# Patient Record
Sex: Female | Born: 1985 | Race: White | Hispanic: No | Marital: Married | Smoking: Never smoker
Health system: Southern US, Community
[De-identification: ages and names within clinical notes are randomized; demographics above are authoritative.]

## PROBLEM LIST (undated history)

## (undated) DIAGNOSIS — I509 Heart failure, unspecified: Secondary | ICD-10-CM

## (undated) DIAGNOSIS — O021 Missed abortion: Secondary | ICD-10-CM

## (undated) HISTORY — PX: OTHER SURGICAL HISTORY: SHX169

## (undated) HISTORY — PX: WISDOM TOOTH EXTRACTION: SHX21

---
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
## 2005-04-08 ENCOUNTER — Ambulatory Visit (HOSPITAL_BASED_OUTPATIENT_CLINIC_OR_DEPARTMENT_OTHER): Admission: RE | Admit: 2005-04-08 | Discharge: 2005-04-08 | Payer: Self-pay | Admitting: Orthopedic Surgery

## 2005-04-08 ENCOUNTER — Ambulatory Visit (HOSPITAL_COMMUNITY): Admission: RE | Admit: 2005-04-08 | Discharge: 2005-04-08 | Payer: Self-pay | Admitting: Orthopedic Surgery

## 2005-06-30 ENCOUNTER — Other Ambulatory Visit: Admission: RE | Admit: 2005-06-30 | Discharge: 2005-06-30 | Payer: Self-pay | Admitting: Obstetrics and Gynecology

## 2006-08-01 ENCOUNTER — Encounter: Admission: RE | Admit: 2006-08-01 | Discharge: 2006-08-01 | Payer: Self-pay | Admitting: *Deleted

## 2007-04-22 DIAGNOSIS — O021 Missed abortion: Secondary | ICD-10-CM

## 2007-04-22 HISTORY — DX: Missed abortion: O02.1

## 2007-07-09 ENCOUNTER — Ambulatory Visit (HOSPITAL_COMMUNITY): Admission: RE | Admit: 2007-07-09 | Discharge: 2007-07-09 | Payer: Self-pay | Admitting: Obstetrics and Gynecology

## 2009-01-07 ENCOUNTER — Inpatient Hospital Stay (HOSPITAL_COMMUNITY): Admission: AD | Admit: 2009-01-07 | Discharge: 2009-01-07 | Payer: Self-pay | Admitting: Obstetrics and Gynecology

## 2009-01-13 ENCOUNTER — Inpatient Hospital Stay (HOSPITAL_COMMUNITY): Admission: AD | Admit: 2009-01-13 | Discharge: 2009-01-17 | Payer: Self-pay | Admitting: Obstetrics and Gynecology

## 2009-01-20 ENCOUNTER — Inpatient Hospital Stay: Payer: Self-pay | Admitting: *Deleted

## 2010-07-26 LAB — CBC
HCT: 34.1 % — ABNORMAL LOW (ref 36.0–46.0)
Hemoglobin: 11.5 g/dL — ABNORMAL LOW (ref 12.0–15.0)
MCHC: 33.1 g/dL (ref 30.0–36.0)
MCHC: 33.6 g/dL (ref 30.0–36.0)
MCV: 87.8 fL (ref 78.0–100.0)
RBC: 3.04 MIL/uL — ABNORMAL LOW (ref 3.87–5.11)
RDW: 13.6 % (ref 11.5–15.5)

## 2010-09-06 NOTE — Op Note (Signed)
Laurie Dorsey, Laurie Dorsey                           ACCOUNT NO.:  [id number]   MEDICAL RECORD NO.:  [id number]                   PATIENT TYPE:  AMB   LOCATION:  DSC                                  FACILITY:  [facility]   PHYSICIAN:  Robert A. Thurston Hole, M.D.              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   PROCEDURES:  1. Left knee examination under anesthesia followed by arthroscopic     chondroplasty.  2. Left knee partial synovectomy.  3. Left knee lateral release.   SURGEON:  Elana Alm. Thurston Hole, M.D.   ASSISTANT:  Julien Girt, P.A.   ANESTHESIA:  General.   OPERATIVE TIME:  Thirty minutes.   COMPLICATIONS:  None.   INDICATIONS FOR PROCEDURE:  Laurie Dorsey is a 25 year old who has had two years of  left knee pain increasing in nature with signs and symptoms documenting  patellofemoral chondromalacia and patella subluxation who has failed  conservative care and is now to undergo arthroscopy.   DESCRIPTION OF PROCEDURE:  Laurie Dorsey was brought to the operating room on November 28, 2002 and placed on the operative table in the supine position.  After an  adequate level of general anesthesia was obtained, her left knee was  examined under anesthesia.  She had full range of motion and her knee was  stable ligamentous exam with slight lateral patellar tracking noted and  lateral patella subluxation noted.  The left knee was sterilely injected  with 0.25% Marcaine with epinephrine.  The left leg was prepped using  sterile DuraPrep and draped using sterile technique.  Originally, through an  inferolateral portal, the arthroscope with a pump attached was placed into  an inferomedial portal and  arthroscopic probe was placed.  On initial  inspection of the medial compartment, the articular cartilage was normal and  medial meniscus normal.  The intercolumnar notch was inspected.  Anterior/posterior cruciate ligaments were normal.  The lateral compartment  inspected.  The articular cartilage was normal.  Lateral meniscus was  normal.  Patellofemoral joint showed 20% grade 3 and the rest grade 1 and 2  chondromalacia and this was debrided.  The patella showed moderate lateral  patellar tracking with significant hypertrophic synovitis in the lateral  gutter and this was thoroughly debrided.  Medial gutter showed moderate  synovitis and this was debrided.  A lateral release was then carried out  with an ArthroCare wand and no excessive bleeding was encountered and this  significantly improved patellar tracking to normal.  After this was done, no  further pathology was noted.  At this point, the instruments were removed,  portals closed with 3-0 nylon sutures.  Sterile dressings  were applied and  the patient awakened and taken to the recovery room in stable condition.   FOLLOWUP CARE:  Laurie Dorsey will be followed as an outpatient on Vicodin and  Naprosyn.  I will see her back in the office in a week for sutures out and  followup.                                               Robert A. Thurston Hole, M.D.    RAW/MEDQ  D:  11/28/2002  T:  11/28/2002  Job:  [id number]

## 2010-09-06 NOTE — Op Note (Signed)
Laurie Dorsey, Laurie Dorsey               ACCOUNT NO.:  [id number]   MEDICAL RECORD NO.:  [id number]          PATIENT TYPE:  AMB   LOCATION:  DSC                          FACILITY:  [facility]   PHYSICIAN:  Robert A. Thurston Hole, M.D. DATE OF BIRTH:  June 30, 1985   DATE OF PROCEDURE:  04/08/2005  DATE OF DISCHARGE:                                 OPERATIVE REPORT   PREOPERATIVE DIAGNOSIS:  Left knee chondromalacia and synovitis.   POSTOPERATIVE DIAGNOSIS:  Left knee chondromalacia and synovitis.   PROCEDURE:  Left knee examination under anesthesia followed by arthroscopic  chondroplasty with partial synovectomy.   SURGEON:  Elana Alm. Thurston Hole, M.D.   ASSISTANT:  Julien Girt, P.A.   ANESTHESIA:  Local and MAC.   OPERATIVE TIME:  Thirty minutes.   COMPLICATIONS:  None.   INDICATIONS FOR PROCEDURE:  [name] is 25 year old who twisted her left knee  approximately 2-1/2 to 3 months ago with persistent significant pain, with  exam and MRI documenting chondromalacia and synovitis, who has failed  conservative care and is now to undergo arthroscopy.   DESCRIPTION:  [name] was brought to operating room on April 08, 2005 after  a knee block had been placed in the holding room by Anesthesia.  She was  placed on the operative table in supine position.  Her left knee was  examined under anesthesia.  Range of motion -- 0-130 degrees, knee stable to  ligamentous exam with normal patellar tracking.  Her left leg was prepped  using sterile DuraPrep and draped using sterile technique.  Originally,  through an anterolateral portal, the arthroscope with a pump attachment was  placed and through an anteromedial portal, an arthroscopic probe was placed.  On initial inspection of the medial compartment, articular cartilage was  normal.  Medial meniscus was normal.  Intercondylar notch inspected;  anterior and posterior cruciate ligaments were normal.  Lateral compartment  inspected; the articular cartilage was  normal; lateral meniscus was normal.  Patellofemoral joint showed a distinct grade 3 chondral defect on the medial  patellar facet, which was debrided.  There was a scarred medial plica band  which was debrided.  The patella tracked normally.  No other pathology of  the articular cartilage was noted on the patellofemoral joint.  There was  moderate lateral synovitis as well and this was debrided as well.  The  patient had had a previous lateral release and this was well-healed with  normal patellar tracking.  No other pathology was noted in the medial and  lateral gutters.  After this was done, it was felt that all pathology had  been satisfactorily addressed.  The instruments were removed.  Portals were  closed with 3-0 nylon suture and injected with 0.25% Marcaine with  epinephrine and 4 mg of morphine.  Sterile dressings were applied and the  patient awakened and taken to the recovery room in stable condition.   FOLLOWUP CARE:  Kennedi will be followed as an outpatient on Vicodin and  Naprosyn.  I will see her back in a week for sutures out and followup.      Robert A.  Thurston Hole, M.D.  Electronically Signed     RAW/MEDQ  D:  04/08/2005  T:  04/10/2005  Job:  [id number]

## 2011-01-13 LAB — ABO/RH: ABO/RH(D): O POS

## 2011-06-13 ENCOUNTER — Other Ambulatory Visit (HOSPITAL_COMMUNITY): Payer: Self-pay | Admitting: Obstetrics and Gynecology

## 2011-06-13 DIAGNOSIS — R1900 Intra-abdominal and pelvic swelling, mass and lump, unspecified site: Secondary | ICD-10-CM

## 2011-06-17 ENCOUNTER — Ambulatory Visit (HOSPITAL_COMMUNITY)
Admission: RE | Admit: 2011-06-17 | Discharge: 2011-06-17 | Disposition: A | Discharge status: Home / Self Care | Payer: BC Managed Care – PPO | Source: Ambulatory Visit | Attending: Obstetrics and Gynecology | Admitting: Obstetrics and Gynecology

## 2011-06-17 DIAGNOSIS — R1031 Right lower quadrant pain: Secondary | ICD-10-CM | POA: Insufficient documentation

## 2011-06-17 DIAGNOSIS — N949 Unspecified condition associated with female genital organs and menstrual cycle: Secondary | ICD-10-CM | POA: Insufficient documentation

## 2011-06-17 DIAGNOSIS — R1900 Intra-abdominal and pelvic swelling, mass and lump, unspecified site: Secondary | ICD-10-CM | POA: Insufficient documentation

## 2011-06-17 MED ORDER — IOHEXOL 300 MG/ML  SOLN
100.0000 mL | Freq: Once | INTRAMUSCULAR | Status: AC | PRN
  Administered 2011-06-17: 100 mL via INTRAVENOUS

## 2012-10-31 IMAGING — CT CT ABD-PELV W/ CM
1 of 2 series · 16 of 32 positions shown, 20 images · IV contrast (OMNIPAQUE)
Comparison: August 01, 2006

CLINICAL DATA: Right lower quadrant pain; palpable knot in pelvic
area

CT ABDOMEN AND PELVIS WITH CONTRAST
TECHNIQUE: Multidetector CT imaging of the abdomen and pelvis was
performed following the standard protocol during bolus
administration of intravenous contrast.
Contrast:  100 ml Omni 300

[Series 2: routine abdomen/pelvis with · axial · 0.73mm/px · z∈[-480,-50]mm · 16 of 94 slices shown, 20 images]
[im 4/94  soft-tissue]
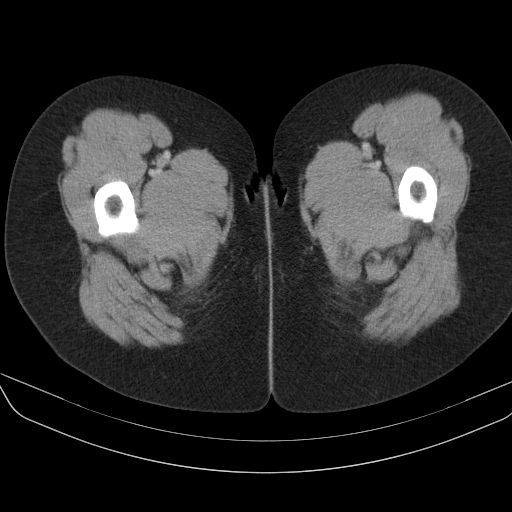
[im 4/94  bone]
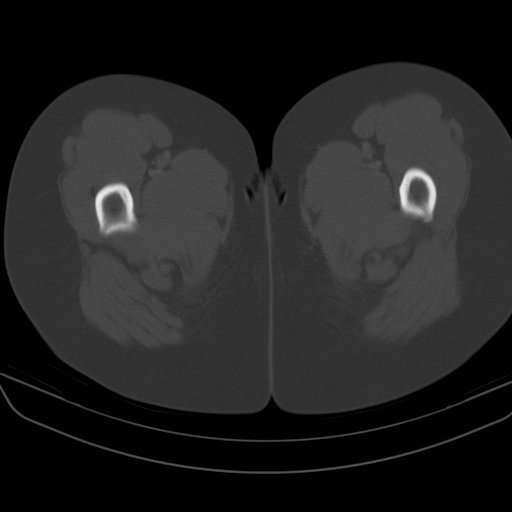
[im 12/94  soft-tissue]
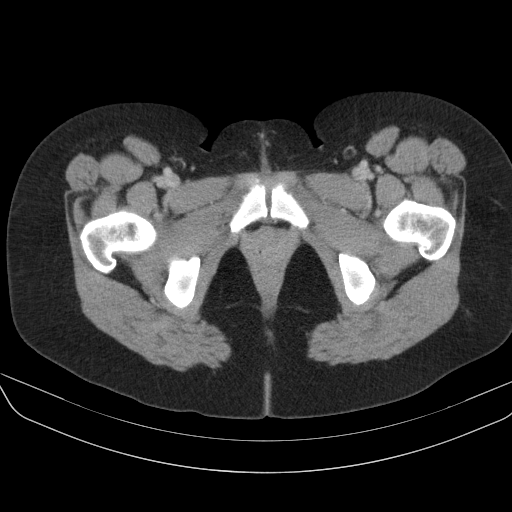
[im 20/94  soft-tissue]
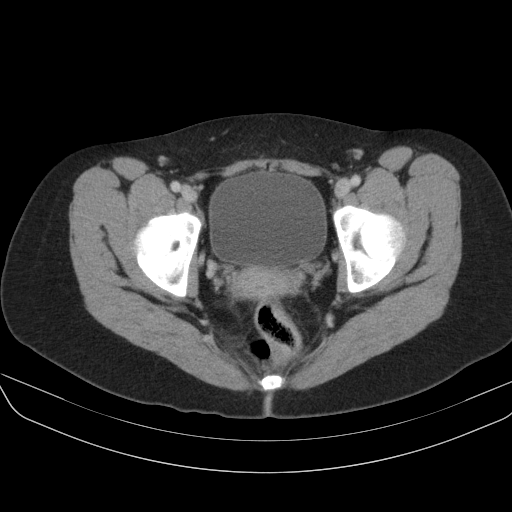
[im 24/94  soft-tissue]
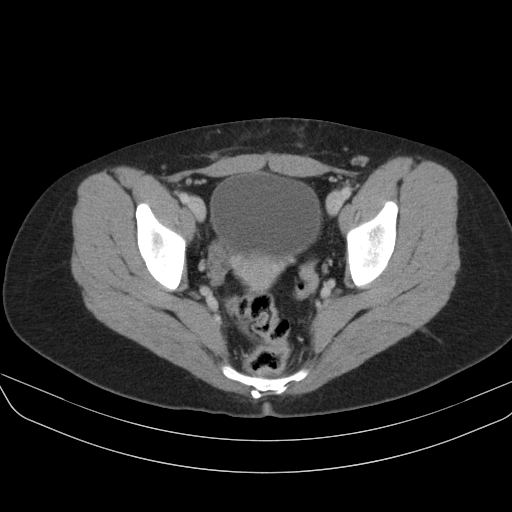
[im 32/94  soft-tissue]
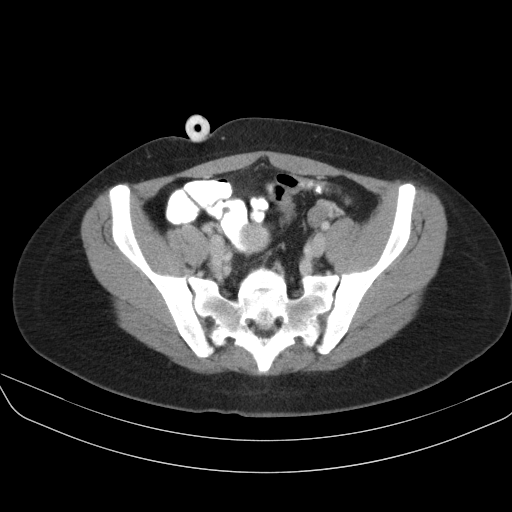
[im 39/94  soft-tissue]
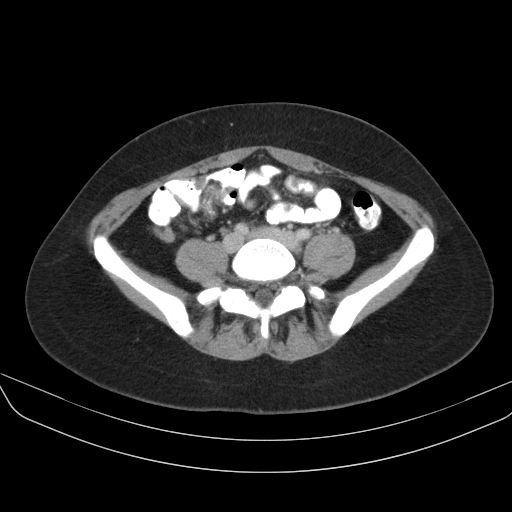
[im 43/94  soft-tissue]
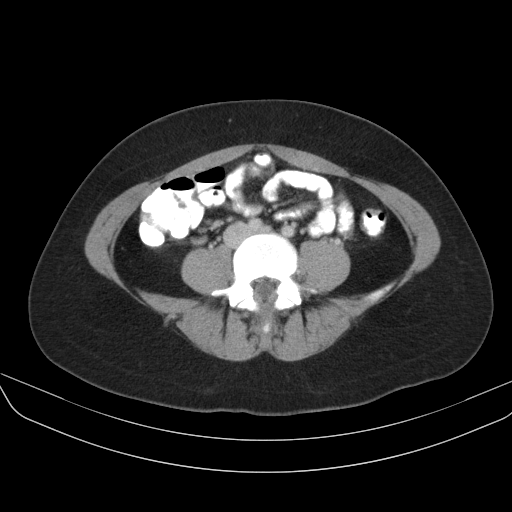
[im 51/94  soft-tissue]
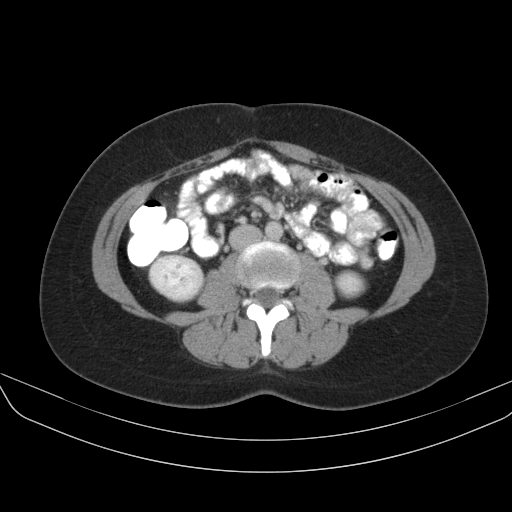
[im 55/94  soft-tissue]
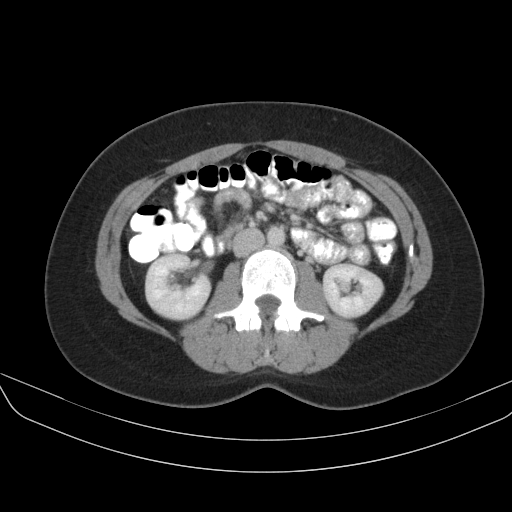
[im 55/94  bone]
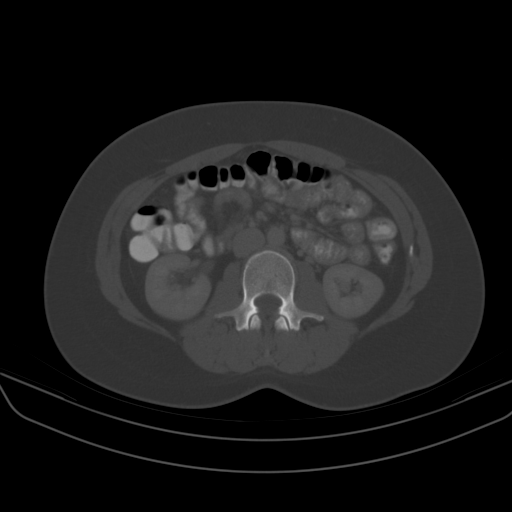
[im 63/94  soft-tissue]
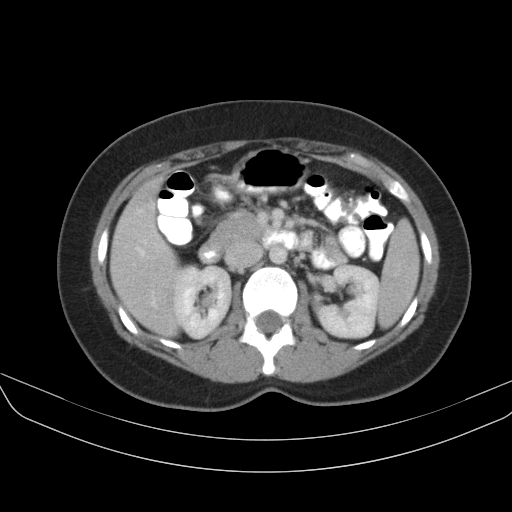
[im 70/94  soft-tissue]
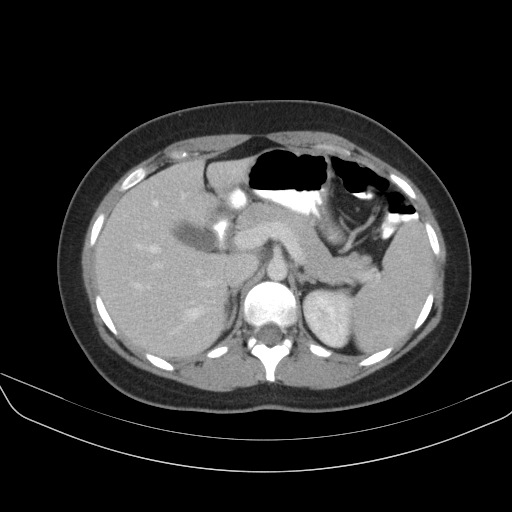
[im 74/94  soft-tissue]
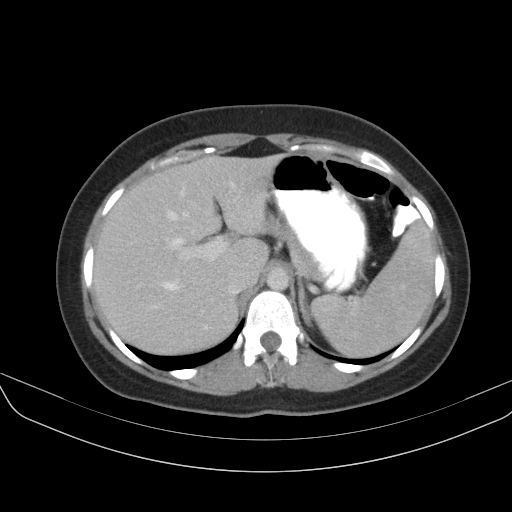
[im 78/94  lung]
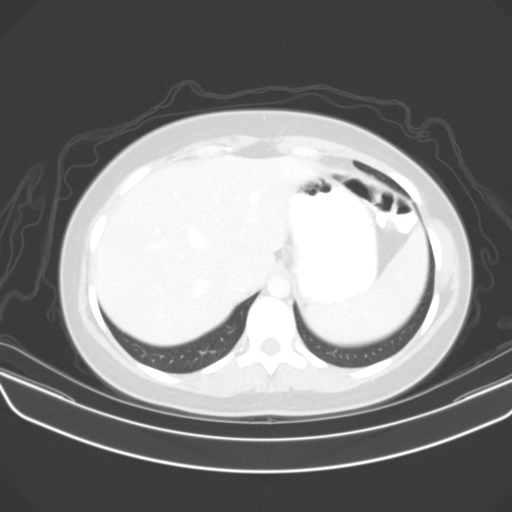
[im 82/94  soft-tissue]
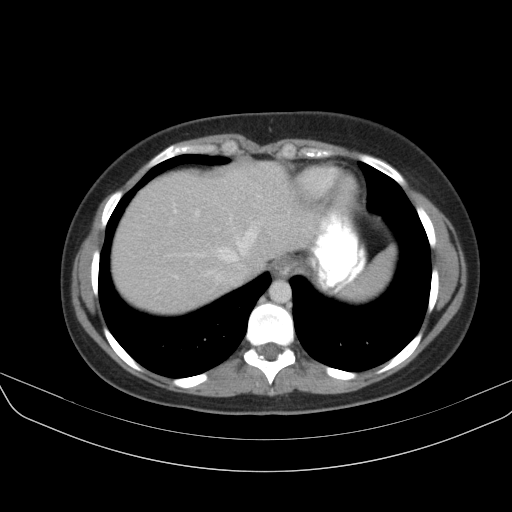
[im 82/94  lung]
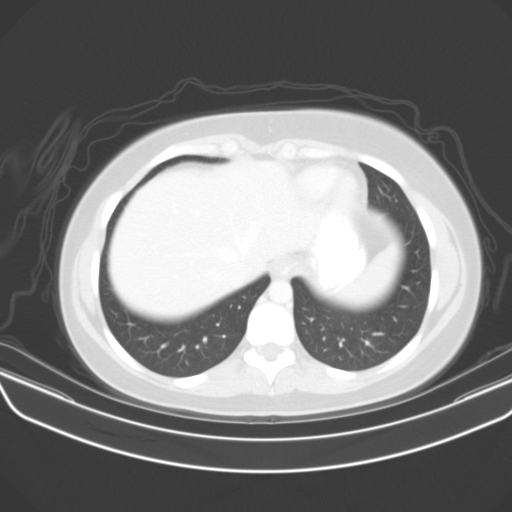
[im 86/94  lung]
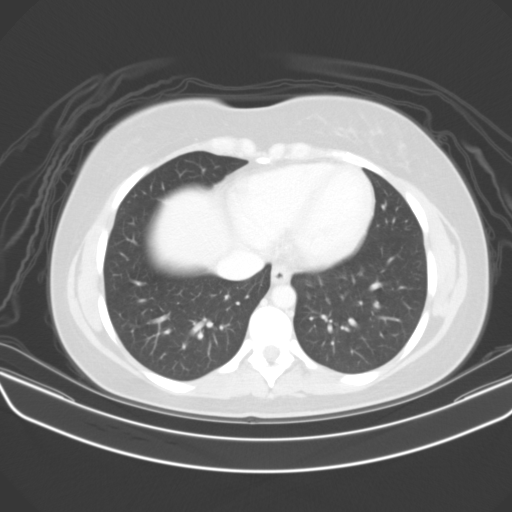
[im 90/94  soft-tissue]
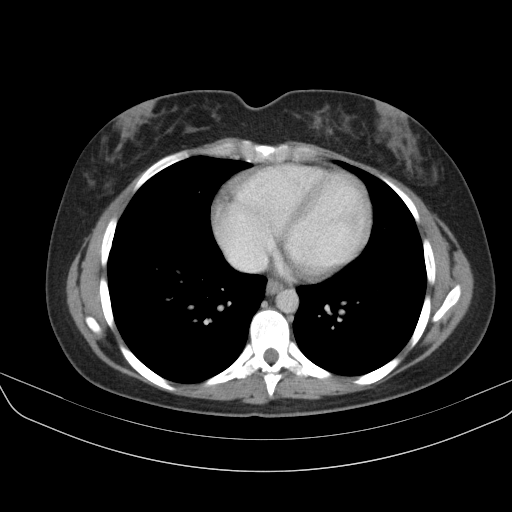
[im 90/94  lung]
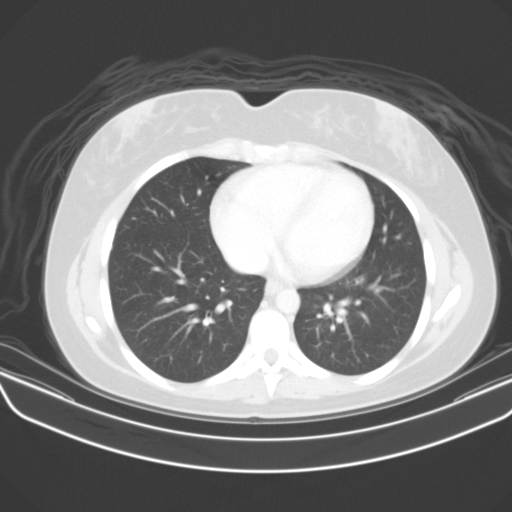

[16 of 32 positions shown; findings below may reference images not displayed]

FINDINGS: The lung bases are clear.  The liver, gallbladder,
spleen, pancreas, adrenal glands, kidneys, urinary bladder, uterus
and adnexa, osseous structures have a normal appearance..  The
appendix is seen in the right lower quadrant and has a normal
appearance.  The bowel is unremarkable with no evidence of gross
inflammation or obstruction.  There is no pneumoperitoneum, free
fluid, or adenopathy within the abdomen or pelvis.  There is no
soft tissue mass in the patient's region of palpable concern which
has been marked by a tourniquet.
IMPRESSION: Normal CT scan of the abdomen pelvis.

## 2013-02-01 LAB — OB RESULTS CONSOLE ABO/RH: RH TYPE: POSITIVE

## 2013-02-01 LAB — OB RESULTS CONSOLE HIV ANTIBODY (ROUTINE TESTING): HIV: NONREACTIVE

## 2013-02-01 LAB — OB RESULTS CONSOLE ANTIBODY SCREEN: ANTIBODY SCREEN: NEGATIVE

## 2013-02-01 LAB — OB RESULTS CONSOLE RPR: RPR: NONREACTIVE

## 2013-02-01 LAB — OB RESULTS CONSOLE HEPATITIS B SURFACE ANTIGEN: Hepatitis B Surface Ag: NEGATIVE

## 2013-02-01 LAB — OB RESULTS CONSOLE RUBELLA ANTIBODY, IGM: Rubella: NON-IMMUNE/NOT IMMUNE

## 2013-08-08 ENCOUNTER — Encounter (HOSPITAL_COMMUNITY): Payer: Self-pay | Admitting: *Deleted

## 2013-08-08 ENCOUNTER — Inpatient Hospital Stay (HOSPITAL_COMMUNITY)
Admission: AD | Admit: 2013-08-08 | Discharge: 2013-08-08 | Disposition: A | Discharge status: Home / Self Care | Payer: BC Managed Care – PPO | Source: Ambulatory Visit | Attending: Obstetrics and Gynecology | Admitting: Obstetrics and Gynecology

## 2013-08-08 DIAGNOSIS — R609 Edema, unspecified: Secondary | ICD-10-CM | POA: Insufficient documentation

## 2013-08-08 DIAGNOSIS — O4700 False labor before 37 completed weeks of gestation, unspecified trimester: Secondary | ICD-10-CM | POA: Insufficient documentation

## 2013-08-08 DIAGNOSIS — O99891 Other specified diseases and conditions complicating pregnancy: Secondary | ICD-10-CM | POA: Insufficient documentation

## 2013-08-08 DIAGNOSIS — O9989 Other specified diseases and conditions complicating pregnancy, childbirth and the puerperium: Principal | ICD-10-CM

## 2013-08-08 DIAGNOSIS — R079 Chest pain, unspecified: Secondary | ICD-10-CM | POA: Insufficient documentation

## 2013-08-08 HISTORY — DX: Heart failure, unspecified: I50.9

## 2013-08-08 LAB — CBC
HCT: 34.4 % — ABNORMAL LOW (ref 36.0–46.0)
HEMOGLOBIN: 11.7 g/dL — AB (ref 12.0–15.0)
MCH: 30 pg (ref 26.0–34.0)
MCHC: 34 g/dL (ref 30.0–36.0)
MCV: 88.2 fL (ref 78.0–100.0)
PLATELETS: 150 10*3/uL (ref 150–400)
RBC: 3.9 MIL/uL (ref 3.87–5.11)
RDW: 14.2 % (ref 11.5–15.5)
WBC: 10.9 10*3/uL — AB (ref 4.0–10.5)

## 2013-08-08 LAB — COMPREHENSIVE METABOLIC PANEL
ALK PHOS: 132 U/L — AB (ref 39–117)
ALT: 18 U/L (ref 0–35)
AST: 20 U/L (ref 0–37)
Albumin: 2.6 g/dL — ABNORMAL LOW (ref 3.5–5.2)
BILIRUBIN TOTAL: 0.3 mg/dL (ref 0.3–1.2)
BUN: 6 mg/dL (ref 6–23)
CHLORIDE: 101 meq/L (ref 96–112)
CO2: 23 meq/L (ref 19–32)
Calcium: 8.4 mg/dL (ref 8.4–10.5)
Creatinine, Ser: 0.38 mg/dL — ABNORMAL LOW (ref 0.50–1.10)
GLUCOSE: 89 mg/dL (ref 70–99)
Potassium: 3.8 mEq/L (ref 3.7–5.3)
SODIUM: 138 meq/L (ref 137–147)
Total Protein: 6 g/dL (ref 6.0–8.3)

## 2013-08-08 NOTE — MAU Note (Signed)
Patient sent from office by Dr. Marcelle OverlieHolland for an EKG for complaint of heaviness in chest and weakness in arms. History of CHF with last pregnancy.

## 2013-08-08 NOTE — Discharge Instructions (Signed)

## 2013-08-08 NOTE — MAU Provider Note (Signed)
History     CSN: [id number]  Arrival date and time: 08/08/13 1611   First Provider Initiated Contact with Patient 08/08/13 1652      Chief Complaint  Patient presents with  . EKG    HPI [name] is a 28 y.o. G3P1011 at 725w2d who presents to MAU today with complaint of chest heaviness. The patient states that she had CHF with her last pregnancy that resolved with medication ~ 2-3 weeks after delivery. She was cleared by her Cardiologist for this pregnancy and told there was no permanent damage. She states that this feeling comes and goes since last night. She has noted associated weakness in her arms, dizziness and nausea. She denies vomiting, SOB today. She does have mild peripheral edema, but states that it resolves with rest.   OB History   Grav Para Term Preterm Abortions TAB SAB Ect Mult Living   3 1 1  1  1   1       Past Medical History  Diagnosis Date  . CHF (congestive heart failure)     Past Surgical History  Procedure Laterality Date  . Knee surgery x4    . Cesarean section      History reviewed. No pertinent family history.  History  Substance Use Topics  . Smoking status: Never Smoker   . Smokeless tobacco: Never Used  . Alcohol Use: No    Allergies: No Known Allergies  No prescriptions prior to admission    Review of Systems  Constitutional: Positive for malaise/fatigue. Negative for fever.  Respiratory: Negative for shortness of breath.   Cardiovascular: Positive for chest pain.  Gastrointestinal: Positive for nausea. Negative for vomiting and abdominal pain.  Genitourinary:       Neg - vaginal bleeding, discharge, LOF  Neurological: Positive for dizziness and weakness. Negative for loss of consciousness.   Physical Exam   Blood pressure 104/66, pulse 78, temperature 98.4 F (36.9 C), temperature source Oral, resp. rate 16, height 5\' 2"  (1.575 m), weight 168 lb 6 oz (76.374 kg), last menstrual period 12/04/2012.  Physical Exam   Constitutional: She is oriented to person, place, and time. She appears well-developed and well-nourished. No distress.  HENT:  Head: Normocephalic and atraumatic.  Cardiovascular: Normal rate, regular rhythm and normal heart sounds.   Respiratory: Effort normal and breath sounds normal. No respiratory distress.  GI: Soft. She exhibits no distension and no mass. There is tenderness (mild tenderness to palpation of the epigatric region). There is no rebound and no guarding.  Neurological: She is alert and oriented to person, place, and time.  Skin: Skin is warm and dry. No erythema.  Psychiatric: She has a normal mood and affect.   Results for orders placed during the hospital encounter of 08/08/13 (from the past 24 hour(s))  CBC     Status: Abnormal   Collection Time    08/08/13  5:48 PM      Result Value Ref Range   WBC 10.9 (*) 4.0 - 10.5 K/uL   RBC 3.90  3.87 - 5.11 MIL/uL   Hemoglobin 11.7 (*) 12.0 - 15.0 g/dL   HCT 96.234.4 (*) 95.236.0 - 84.146.0 %   MCV 88.2  78.0 - 100.0 fL   MCH 30.0  26.0 - 34.0 pg   MCHC 34.0  30.0 - 36.0 g/dL   RDW 32.414.2  40.111.5 - 02.715.5 %   Platelets 150  150 - 400 K/uL  COMPREHENSIVE METABOLIC PANEL     Status:  Abnormal   Collection Time    08/08/13  5:48 PM      Result Value Ref Range   Sodium 138  137 - 147 mEq/L   Potassium 3.8  3.7 - 5.3 mEq/L   Chloride 101  96 - 112 mEq/L   CO2 23  19 - 32 mEq/L   Glucose, Bld 89  70 - 99 mg/dL   BUN 6  6 - 23 mg/dL   Creatinine, Ser 1.610.38 (*) 0.50 - 1.10 mg/dL   Calcium 8.4  8.4 - 09.610.5 mg/dL   Total Protein 6.0  6.0 - 8.3 g/dL   Albumin 2.6 (*) 3.5 - 5.2 g/dL   AST 20  0 - 37 U/L   ALT 18  0 - 35 U/L   Alkaline Phosphatase 132 (*) 39 - 117 U/L   Total Bilirubin 0.3  0.3 - 1.2 mg/dL   GFR calc non Af Amer >90  >90 mL/min   GFR calc Af Amer >90  >90 mL/min    Fetal Monitoring: Baseline: 120 bpm, moderate variability, + accelerations, no decelerations Contractions: irregular, mild to palpation with moderate UI MAU  Course  Procedures None  MDM EKG ordered - EKG shows NSR Discussed with Dr. Marcelle OverlieHolland CBC, CMP and continuous pulse ox today O2 - 98-100% during visit Labs are essentially normal for pregnancy Ok for discharge per Dr. Marcelle OverlieHolland Assessment and Plan  A: Chest pain  P: Discharge home Patient advised to follow-up with [facility] as scheduled on Thursday or sooner if symptoms change or worsen Patient may return to MAU as needed or if her condition were to change or worsen  Freddi StarrJulie N Ethier, PA-C  08/08/2013, 7:13 PM

## 2013-08-08 NOTE — MAU Note (Signed)
EKG completed

## 2013-08-11 LAB — OB RESULTS CONSOLE GBS: GBS: POSITIVE

## 2013-08-25 ENCOUNTER — Encounter (HOSPITAL_COMMUNITY): Payer: Self-pay

## 2013-08-25 NOTE — H&P (Signed)
Laurie Dorsey is a 28 year G 3 P 1 EDC 09/10/2013 presents for Repeat LTCS. She has had an uncomplicated pregnancy. History OB History   Grav Para Term Preterm Abortions TAB SAB Ect Mult Living   3 1 1  1  1   1      Past Medical History  Diagnosis Date  . CHF (congestive heart failure)    Past Surgical History  Procedure Laterality Date  . Knee surgery x4    . Cesarean section     Family History: family history is not on file. Social History:  reports that she has never smoked. She has never used smokeless tobacco. She reports that she does not drink alcohol or use illicit drugs.   Prenatal Transfer Tool  Maternal Diabetes: No Genetic Screening: Normal Maternal Ultrasounds/Referrals: Normal Fetal Ultrasounds or other Referrals:  None Maternal Substance Abuse:  No Significant Maternal Medications:  None Significant Maternal Lab Results:  None Other Comments:  None  Review of Systems  All other systems reviewed and are negative.     Last menstrual period 12/04/2012. Maternal Exam:  Introitus: Normal vulva. Normal vagina.  Ferning test: not done.  Nitrazine test: not done.     Physical Exam  Nursing note and vitals reviewed. Constitutional: She appears well-developed.  HENT:  Head: Normocephalic.  Eyes: Pupils are equal, round, and reactive to light.  Neck: Normal range of motion.  Cardiovascular: Normal rate and regular rhythm.   Respiratory: Effort normal.  GI: Soft.    Prenatal labs: ABO, Rh:   Antibody:   Rubella:   RPR:    HBsAg:    HIV:    GBS:     Assessment/Plan: IUP at term Previous C Section Repeat LTCS Risks reviewed  Consent signed   Laurie Dorsey 08/25/2013, 11:58 AM

## 2013-09-01 ENCOUNTER — Encounter (HOSPITAL_COMMUNITY): Payer: Self-pay

## 2013-09-02 ENCOUNTER — Encounter (HOSPITAL_COMMUNITY): Payer: Self-pay

## 2013-09-02 ENCOUNTER — Encounter (HOSPITAL_COMMUNITY)
Admission: RE | Admit: 2013-09-02 | Discharge: 2013-09-02 | Disposition: A | Discharge status: Home / Self Care | Payer: BC Managed Care – PPO | Source: Ambulatory Visit | Attending: Obstetrics and Gynecology | Admitting: Obstetrics and Gynecology

## 2013-09-02 HISTORY — DX: Missed abortion: O02.1

## 2013-09-02 LAB — CBC
HEMATOCRIT: 36.7 % (ref 36.0–46.0)
Hemoglobin: 12.2 g/dL (ref 12.0–15.0)
MCH: 29.5 pg (ref 26.0–34.0)
MCHC: 33.2 g/dL (ref 30.0–36.0)
MCV: 88.6 fL (ref 78.0–100.0)
Platelets: 159 10*3/uL (ref 150–400)
RBC: 4.14 MIL/uL (ref 3.87–5.11)
RDW: 14.4 % (ref 11.5–15.5)
WBC: 9.9 10*3/uL (ref 4.0–10.5)

## 2013-09-02 LAB — RPR

## 2013-09-02 LAB — TYPE AND SCREEN
ABO/RH(D): O POS
Antibody Screen: NEGATIVE

## 2013-09-02 NOTE — Patient Instructions (Addendum)
   Your procedure is scheduled on:  Saturday, May 16  [facility] through [facility] at: 730 AM Pick up the phone at the desk and dial [phone] and inform us of your arrival.  Please call this number if you have any problems the morning of surgery: [phone]  Remember: Do not eat or drink after midnight: Friday Take these medicines the morning of surgery with a SIP OF WATER:  None  Do not wear jewelry, make-up, or FINGER nail polish No metal in your hair or on your body. Do not wear lotions, powders, perfumes.  You may wear deodorant.  Do not bring valuables to the hospital. Contacts, dentures or bridgework may not be worn into surgery.  Leave suitcase in the car. After Surgery it may be brought to your room. For patients being admitted to the hospital, checkout time is 11:00am the day of discharge.  Home with husband Laurie CowerJason cell [phone]

## 2013-09-03 ENCOUNTER — Encounter (HOSPITAL_COMMUNITY)
Admission: AD | Disposition: A | Discharge status: Home / Self Care | Payer: Self-pay | Source: Ambulatory Visit | Attending: Obstetrics and Gynecology

## 2013-09-03 ENCOUNTER — Inpatient Hospital Stay (HOSPITAL_COMMUNITY)
Admission: RE | Admit: 2013-09-03 | Discharge: 2013-09-05 | DRG: 765 | Disposition: A | Discharge status: Home / Self Care | Payer: BC Managed Care – PPO | Source: Ambulatory Visit | Attending: Obstetrics and Gynecology | Admitting: Obstetrics and Gynecology

## 2013-09-03 ENCOUNTER — Encounter (HOSPITAL_COMMUNITY): Payer: BC Managed Care – PPO | Admitting: Anesthesiology

## 2013-09-03 ENCOUNTER — Inpatient Hospital Stay (HOSPITAL_COMMUNITY): Payer: BC Managed Care – PPO | Admitting: Anesthesiology

## 2013-09-03 ENCOUNTER — Encounter (HOSPITAL_COMMUNITY): Payer: Self-pay

## 2013-09-03 ENCOUNTER — Encounter (HOSPITAL_COMMUNITY)
Admission: RE | Disposition: A | Discharge status: Home / Self Care | Payer: Self-pay | Source: Ambulatory Visit | Attending: Obstetrics and Gynecology

## 2013-09-03 DIAGNOSIS — O34219 Maternal care for unspecified type scar from previous cesarean delivery: Principal | ICD-10-CM | POA: Diagnosis present

## 2013-09-03 DIAGNOSIS — Z302 Encounter for sterilization: Secondary | ICD-10-CM

## 2013-09-03 DIAGNOSIS — I2510 Atherosclerotic heart disease of native coronary artery without angina pectoris: Secondary | ICD-10-CM | POA: Diagnosis present

## 2013-09-03 DIAGNOSIS — B069 Rubella without complication: Secondary | ICD-10-CM | POA: Diagnosis present

## 2013-09-03 DIAGNOSIS — I509 Heart failure, unspecified: Secondary | ICD-10-CM | POA: Diagnosis present

## 2013-09-03 DIAGNOSIS — O9852 Other viral diseases complicating childbirth: Secondary | ICD-10-CM

## 2013-09-03 DIAGNOSIS — O9942 Diseases of the circulatory system complicating childbirth: Secondary | ICD-10-CM

## 2013-09-03 DIAGNOSIS — Z98891 History of uterine scar from previous surgery: Secondary | ICD-10-CM

## 2013-09-03 SURGERY — Surgical Case
Anesthesia: Spinal | Site: Abdomen

## 2013-09-03 SURGERY — Surgical Case
Anesthesia: Regional

## 2013-09-03 MED ORDER — BUPIVACAINE IN DEXTROSE 0.75-8.25 % IT SOLN
INTRATHECAL | Status: DC | PRN
  Administered 2013-09-03: 1.4 mL via INTRATHECAL

## 2013-09-03 MED ORDER — PROMETHAZINE HCL 25 MG/ML IJ SOLN: 6.2500 mg | INTRAMUSCULAR | Status: DC | PRN

## 2013-09-03 MED ORDER — TETANUS-DIPHTH-ACELL PERTUSSIS 5-2.5-18.5 LF-MCG/0.5 IM SUSP: 0.5000 mL | Freq: Once | INTRAMUSCULAR | Status: DC

## 2013-09-03 MED ORDER — ONDANSETRON HCL 4 MG/2ML IJ SOLN
INTRAMUSCULAR | Status: DC | PRN
  Administered 2013-09-03: 4 mg via INTRAVENOUS

## 2013-09-03 MED ORDER — SIMETHICONE 80 MG PO CHEW: 80.0000 mg | CHEWABLE_TABLET | ORAL | Status: DC | PRN

## 2013-09-03 MED ORDER — IBUPROFEN 600 MG PO TABS: 600.0000 mg | ORAL_TABLET | Freq: Four times a day (QID) | ORAL | Status: DC | PRN

## 2013-09-03 MED ORDER — KETOROLAC TROMETHAMINE 30 MG/ML IJ SOLN: 30.0000 mg | Freq: Four times a day (QID) | INTRAMUSCULAR | Status: AC | PRN

## 2013-09-03 MED ORDER — ONDANSETRON HCL 4 MG/2ML IJ SOLN
INTRAMUSCULAR | Status: AC
  Filled 2013-09-03: qty 2

## 2013-09-03 MED ORDER — MEPERIDINE HCL 25 MG/ML IJ SOLN
INTRAMUSCULAR | Status: AC
  Filled 2013-09-03: qty 1

## 2013-09-03 MED ORDER — DIPHENHYDRAMINE HCL 25 MG PO CAPS: 25.0000 mg | ORAL_CAPSULE | ORAL | Status: DC | PRN

## 2013-09-03 MED ORDER — LACTATED RINGERS IV SOLN
INTRAVENOUS | Status: DC
  Administered 2013-09-03 (×2): via INTRAVENOUS
  Administered 2013-09-03: 100 mL/h via INTRAVENOUS

## 2013-09-03 MED ORDER — ONDANSETRON HCL 4 MG/2ML IJ SOLN: 4.0000 mg | INTRAMUSCULAR | Status: DC | PRN

## 2013-09-03 MED ORDER — DIPHENHYDRAMINE HCL 50 MG/ML IJ SOLN: 25.0000 mg | INTRAMUSCULAR | Status: DC | PRN

## 2013-09-03 MED ORDER — METOCLOPRAMIDE HCL 5 MG/ML IJ SOLN
INTRAMUSCULAR | Status: DC | PRN
  Administered 2013-09-03: 10 mg via INTRAVENOUS

## 2013-09-03 MED ORDER — CETIRIZINE HCL 10 MG PO TABS
10.0000 mg | ORAL_TABLET | Freq: Every day | ORAL | Status: DC
  Filled 2013-09-03 (×2): qty 1

## 2013-09-03 MED ORDER — NALBUPHINE HCL 10 MG/ML IJ SOLN: 5.0000 mg | INTRAMUSCULAR | Status: DC | PRN

## 2013-09-03 MED ORDER — SIMETHICONE 80 MG PO CHEW
80.0000 mg | CHEWABLE_TABLET | ORAL | Status: DC
  Administered 2013-09-03 – 2013-09-04 (×2): 80 mg via ORAL
  Filled 2013-09-03: qty 1

## 2013-09-03 MED ORDER — MEPERIDINE HCL 25 MG/ML IJ SOLN
INTRAMUSCULAR | Status: DC | PRN
  Administered 2013-09-03 (×2): 12.5 mg via INTRAVENOUS

## 2013-09-03 MED ORDER — MORPHINE SULFATE (PF) 0.5 MG/ML IJ SOLN
INTRAMUSCULAR | Status: DC | PRN
  Administered 2013-09-03: .15 mg via EPIDURAL

## 2013-09-03 MED ORDER — CEFAZOLIN SODIUM-DEXTROSE 2-3 GM-% IV SOLR
INTRAVENOUS | Status: AC
  Filled 2013-09-03: qty 50

## 2013-09-03 MED ORDER — ACETAMINOPHEN 500 MG PO TABS
1000.0000 mg | ORAL_TABLET | Freq: Four times a day (QID) | ORAL | Status: AC
  Administered 2013-09-03 – 2013-09-04 (×2): 1000 mg via ORAL
  Filled 2013-09-03 (×2): qty 2

## 2013-09-03 MED ORDER — IBUPROFEN 600 MG PO TABS
600.0000 mg | ORAL_TABLET | Freq: Four times a day (QID) | ORAL | Status: DC
  Administered 2013-09-03 – 2013-09-05 (×6): 600 mg via ORAL
  Filled 2013-09-03 (×6): qty 1

## 2013-09-03 MED ORDER — LACTATED RINGERS IV SOLN
INTRAVENOUS | Status: DC
  Administered 2013-09-03: 75 mL/h via INTRAVENOUS

## 2013-09-03 MED ORDER — PHENYLEPHRINE HCL 10 MG/ML IJ SOLN
INTRAMUSCULAR | Status: DC | PRN
  Administered 2013-09-03: 80 ug via INTRAVENOUS
  Administered 2013-09-03: 120 ug via INTRAVENOUS

## 2013-09-03 MED ORDER — MENTHOL 3 MG MT LOZG: 1.0000 | LOZENGE | OROMUCOSAL | Status: DC | PRN

## 2013-09-03 MED ORDER — SCOPOLAMINE 1 MG/3DAYS TD PT72
MEDICATED_PATCH | TRANSDERMAL | Status: AC
  Administered 2013-09-03: 1.5 mg via TRANSDERMAL
  Filled 2013-09-03: qty 1

## 2013-09-03 MED ORDER — DIPHENHYDRAMINE HCL 25 MG PO CAPS: 25.0000 mg | ORAL_CAPSULE | Freq: Four times a day (QID) | ORAL | Status: DC | PRN

## 2013-09-03 MED ORDER — ZOLPIDEM TARTRATE 5 MG PO TABS
5.0000 mg | ORAL_TABLET | Freq: Every evening | ORAL | Status: DC | PRN
Start: 2013-09-03 — End: 2013-09-05

## 2013-09-03 MED ORDER — MORPHINE SULFATE 0.5 MG/ML IJ SOLN
INTRAMUSCULAR | Status: AC
  Filled 2013-09-03: qty 10

## 2013-09-03 MED ORDER — SCOPOLAMINE 1 MG/3DAYS TD PT72
1.0000 | MEDICATED_PATCH | Freq: Once | TRANSDERMAL | Status: DC
  Administered 2013-09-03: 1.5 mg via TRANSDERMAL

## 2013-09-03 MED ORDER — FENTANYL CITRATE 0.05 MG/ML IJ SOLN
25.0000 ug | INTRAMUSCULAR | Status: DC | PRN
Start: 2013-09-03 — End: 2013-09-03

## 2013-09-03 MED ORDER — OXYTOCIN 10 UNIT/ML IJ SOLN
40.0000 [IU] | INTRAVENOUS | Status: DC | PRN
  Administered 2013-09-03: 40 [IU] via INTRAVENOUS

## 2013-09-03 MED ORDER — KETOROLAC TROMETHAMINE 30 MG/ML IJ SOLN
INTRAMUSCULAR | Status: AC
  Administered 2013-09-03: 30 mg via INTRAVENOUS
  Filled 2013-09-03: qty 1

## 2013-09-03 MED ORDER — MEPERIDINE HCL 25 MG/ML IJ SOLN: 6.2500 mg | INTRAMUSCULAR | Status: DC | PRN

## 2013-09-03 MED ORDER — KETOROLAC TROMETHAMINE 30 MG/ML IJ SOLN
30.0000 mg | Freq: Four times a day (QID) | INTRAMUSCULAR | Status: AC | PRN
  Administered 2013-09-03 (×2): 30 mg via INTRAVENOUS
  Filled 2013-09-03: qty 1

## 2013-09-03 MED ORDER — SENNOSIDES-DOCUSATE SODIUM 8.6-50 MG PO TABS
2.0000 | ORAL_TABLET | ORAL | Status: DC
  Administered 2013-09-03 – 2013-09-04 (×2): 2 via ORAL
  Filled 2013-09-03 (×2): qty 2

## 2013-09-03 MED ORDER — OXYTOCIN 10 UNIT/ML IJ SOLN: 40.0000 [IU] | INTRAVENOUS | Status: DC | PRN

## 2013-09-03 MED ORDER — OXYTOCIN 10 UNIT/ML IJ SOLN
INTRAMUSCULAR | Status: AC
  Filled 2013-09-03: qty 4

## 2013-09-03 MED ORDER — DIPHENHYDRAMINE HCL 50 MG/ML IJ SOLN
INTRAMUSCULAR | Status: AC
  Filled 2013-09-03: qty 1

## 2013-09-03 MED ORDER — NALOXONE HCL 0.4 MG/ML IJ SOLN: 0.4000 mg | INTRAMUSCULAR | Status: DC | PRN

## 2013-09-03 MED ORDER — LANOLIN HYDROUS EX OINT: 1.0000 "application " | TOPICAL_OINTMENT | CUTANEOUS | Status: DC | PRN

## 2013-09-03 MED ORDER — SIMETHICONE 80 MG PO CHEW
80.0000 mg | CHEWABLE_TABLET | Freq: Three times a day (TID) | ORAL | Status: DC
  Administered 2013-09-03 – 2013-09-05 (×5): 80 mg via ORAL
  Filled 2013-09-03 (×5): qty 1

## 2013-09-03 MED ORDER — FENTANYL CITRATE 0.05 MG/ML IJ SOLN
INTRAMUSCULAR | Status: AC
  Filled 2013-09-03: qty 2

## 2013-09-03 MED ORDER — DIPHENHYDRAMINE HCL 50 MG/ML IJ SOLN: 12.5000 mg | INTRAMUSCULAR | Status: DC | PRN

## 2013-09-03 MED ORDER — FENTANYL CITRATE 0.05 MG/ML IJ SOLN
INTRAMUSCULAR | Status: DC | PRN
  Administered 2013-09-03: 25 ug via INTRATHECAL

## 2013-09-03 MED ORDER — NALOXONE HCL 1 MG/ML IJ SOLN
1.0000 ug/kg/h | INTRAVENOUS | Status: DC | PRN
  Filled 2013-09-03: qty 2

## 2013-09-03 MED ORDER — CITRIC ACID-SODIUM CITRATE 334-500 MG/5ML PO SOLN
ORAL | Status: AC
  Filled 2013-09-03: qty 15

## 2013-09-03 MED ORDER — ONDANSETRON HCL 4 MG PO TABS: 4.0000 mg | ORAL_TABLET | ORAL | Status: DC | PRN

## 2013-09-03 MED ORDER — SCOPOLAMINE 1 MG/3DAYS TD PT72
1.0000 | MEDICATED_PATCH | Freq: Once | TRANSDERMAL | Status: DC
  Filled 2013-09-03: qty 1

## 2013-09-03 MED ORDER — SODIUM CHLORIDE 0.9 % IJ SOLN
3.0000 mL | INTRAMUSCULAR | Status: DC | PRN
  Administered 2013-09-04: 3 mL via INTRAVENOUS

## 2013-09-03 MED ORDER — DIBUCAINE 1 % RE OINT: 1.0000 "application " | TOPICAL_OINTMENT | RECTAL | Status: DC | PRN

## 2013-09-03 MED ORDER — DIPHENHYDRAMINE HCL 50 MG/ML IJ SOLN
INTRAMUSCULAR | Status: DC | PRN
  Administered 2013-09-03: 25 mg via INTRAVENOUS

## 2013-09-03 MED ORDER — LACTATED RINGERS IV SOLN: INTRAVENOUS | Status: DC

## 2013-09-03 MED ORDER — PRENATAL MULTIVITAMIN CH
1.0000 | ORAL_TABLET | Freq: Every day | ORAL | Status: DC
  Administered 2013-09-04: 1 via ORAL
  Filled 2013-09-03: qty 1

## 2013-09-03 MED ORDER — PHENYLEPHRINE 8 MG IN D5W 100 ML (0.08MG/ML) PREMIX OPTIME
INJECTION | INTRAVENOUS | Status: DC | PRN
  Administered 2013-09-03: 60 ug/min via INTRAVENOUS

## 2013-09-03 MED ORDER — CEFAZOLIN SODIUM-DEXTROSE 2-3 GM-% IV SOLR
2.0000 g | INTRAVENOUS | Status: AC
  Administered 2013-09-03: 2 g via INTRAVENOUS

## 2013-09-03 MED ORDER — METOCLOPRAMIDE HCL 5 MG/ML IJ SOLN
INTRAMUSCULAR | Status: AC
  Filled 2013-09-03: qty 2

## 2013-09-03 MED ORDER — MIDAZOLAM HCL 2 MG/2ML IJ SOLN: 0.5000 mg | Freq: Once | INTRAMUSCULAR | Status: DC | PRN

## 2013-09-03 MED ORDER — OXYTOCIN 40 UNITS IN LACTATED RINGERS INFUSION - SIMPLE MED: 62.5000 mL/h | INTRAVENOUS | Status: AC

## 2013-09-03 MED ORDER — BUPIVACAINE HCL (PF) 0.25 % IJ SOLN
INTRAMUSCULAR | Status: AC
  Filled 2013-09-03: qty 30

## 2013-09-03 MED ORDER — WITCH HAZEL-GLYCERIN EX PADS
1.0000 | MEDICATED_PAD | CUTANEOUS | Status: DC | PRN
Start: 2013-09-03 — End: 2013-09-05

## 2013-09-03 MED ORDER — LORATADINE 10 MG PO TABS
10.0000 mg | ORAL_TABLET | Freq: Every day | ORAL | Status: DC
  Filled 2013-09-03 (×3): qty 1

## 2013-09-03 MED ORDER — OXYCODONE-ACETAMINOPHEN 5-325 MG PO TABS: 1.0000 | ORAL_TABLET | ORAL | Status: DC | PRN

## 2013-09-03 MED ORDER — ONDANSETRON HCL 4 MG/2ML IJ SOLN: 4.0000 mg | Freq: Three times a day (TID) | INTRAMUSCULAR | Status: DC | PRN

## 2013-09-03 MED ORDER — METOCLOPRAMIDE HCL 5 MG/ML IJ SOLN: 10.0000 mg | Freq: Three times a day (TID) | INTRAMUSCULAR | Status: DC | PRN

## 2013-09-03 SURGICAL SUPPLY — 35 items
ADH SKN CLS APL DERMABOND .7 (GAUZE/BANDAGES/DRESSINGS) ×1
BARRIER ADHS 3X4 INTERCEED (GAUZE/BANDAGES/DRESSINGS) IMPLANT
BRR ADH 4X3 ABS CNTRL BYND (GAUZE/BANDAGES/DRESSINGS)
CLAMP CORD UMBIL (MISCELLANEOUS) IMPLANT
CLIP FILSHIE TUBAL LIGA STRL (Clip) ×1 IMPLANT
CLOTH BEACON ORANGE TIMEOUT ST (SAFETY) ×2 IMPLANT
CONTAINER PREFILL 10% NBF 15ML (MISCELLANEOUS) IMPLANT
DERMABOND ADVANCED (GAUZE/BANDAGES/DRESSINGS) ×1
DERMABOND ADVANCED .7 DNX12 (GAUZE/BANDAGES/DRESSINGS) IMPLANT
DRAPE LG THREE QUARTER DISP (DRAPES) IMPLANT
DRSG OPSITE POSTOP 4X10 (GAUZE/BANDAGES/DRESSINGS) ×2 IMPLANT
DURAPREP 26ML APPLICATOR (WOUND CARE) ×2 IMPLANT
ELECT REM PT RETURN 9FT ADLT (ELECTROSURGICAL) ×2
ELECTRODE REM PT RTRN 9FT ADLT (ELECTROSURGICAL) ×1 IMPLANT
EXTRACTOR VACUUM M CUP 4 TUBE (SUCTIONS) IMPLANT
GLOVE BIO SURGEON STRL SZ 6.5 (GLOVE) ×2 IMPLANT
GOWN STRL REUS W/TWL LRG LVL3 (GOWN DISPOSABLE) ×4 IMPLANT
KIT ABG SYR 3ML LUER SLIP (SYRINGE) IMPLANT
NDL HYPO 25X5/8 SAFETYGLIDE (NEEDLE) ×1 IMPLANT
NEEDLE HYPO 22GX1.5 SAFETY (NEEDLE) IMPLANT
NEEDLE HYPO 25X5/8 SAFETYGLIDE (NEEDLE) ×2 IMPLANT
NS IRRIG 1000ML POUR BTL (IV SOLUTION) ×2 IMPLANT
PACK C SECTION WH (CUSTOM PROCEDURE TRAY) ×2 IMPLANT
PAD OB MATERNITY 4.3X12.25 (PERSONAL CARE ITEMS) ×2 IMPLANT
STAPLER VISISTAT 35W (STAPLE) IMPLANT
SUT CHROMIC 0 CTX 36 (SUTURE) ×4 IMPLANT
SUT PLAIN 0 NONE (SUTURE) IMPLANT
SUT PLAIN 2 0 XLH (SUTURE) IMPLANT
SUT VIC AB 0 CT1 27 (SUTURE) ×6
SUT VIC AB 0 CT1 27XBRD ANBCTR (SUTURE) ×3 IMPLANT
SUT VIC AB 4-0 KS 27 (SUTURE) IMPLANT
SYR CONTROL 10ML LL (SYRINGE) IMPLANT
TOWEL OR 17X24 6PK STRL BLUE (TOWEL DISPOSABLE) ×2 IMPLANT
TRAY FOLEY CATH 14FR (SET/KITS/TRAYS/PACK) ×2 IMPLANT
WATER STERILE IRR 1000ML POUR (IV SOLUTION) ×2 IMPLANT

## 2013-09-03 NOTE — Op Note (Signed)
NAME:  Laurie RodesHOBBS, Kamilah                  ACCOUNT NO.:  [id number]  MEDICAL RECORD NO.:  [id number]  LOCATION:  9132                          FACILITY:  [facility]  PHYSICIAN:  Arman Loy L. Vali Capano, M.D.DATE OF BIRTH:  March 22, 1986  DATE OF PROCEDURE:  09/03/2013 DATE OF DISCHARGE:                              OPERATIVE REPORT   PREOPERATIVE DIAGNOSIS:  Intrauterine pregnancy at 39 weeks, previous cesarean section, and desires permanent sterilization.  POSTOPERATIVE DIAGNOSIS:  Intrauterine pregnancy at 39 weeks, previous cesarean section, and desires permanent sterilization.  PROCEDURES:  Repeat low transverse cesarean section and bilateral tubal ligation with placement of Filshie clips.  SURGEON:  Amdrew Oboyle L. Vincente PoliGrewal, M.D.  ANESTHESIA:  Spinal.  EBL:  Less than 500 mL.  COMPLICATIONS:  None.  DRAINS:  Foley catheter.  PROCEDURE:  The patient was taken to the operating room.  Her spinal was placed by Dr. Jean RosenthalJackson without complications.  She was prepped and draped.  Foley catheter was inserted.  Time-out was performed.  A low transverse incision was made.  It was carried down to the fascia. Fascia scored in the midline extended laterally.  Rectus muscles were separated in the midline.  The peritoneum was entered bluntly.  The peritoneal incision was then stretched.  There were some adhesions in the lower uterine segment.  We then freed this up with sharp dissection with careful attention to avoid injury to the bladder.  The bladder blade was inserted.  A low transverse incision was made, and amniotic fluid was clear.  The baby was delivered easily, was in cephalic presentation, was a female infant, Apgars 9 at 1 minute, 9 at 5 minutes. The cord was clamped and cut.  The baby was handed to the awaiting neonatal team.  Placenta was manually removed after cord blood was obtained.  The uterus was exteriorized and cleared of all clots and debris.  The uterine incision was closed in 1 layer using 0  chromic in a running, locked stitch.  Attention was then turned to the fallopian tubes.  We placed Filshie clips across each mid portion of the fallopian tubes for the tubal ligation.  The uterus was returned to the abdomen. Irrigation was performed.  Hemostasis was again noted.  The peritoneum was closed using 0 Vicryl.  The fascia was closed using 0 Vicryl.  After irrigation of subcutaneous layer, the skin was closed with a 4-0 Vicryl on a Keith needle.  Dermabond was applied.  All sponge, lap, and instrument counts were correct x2.  The patient went to recovery room in stable condition.     Ardith Test L. Vincente PoliGrewal, M.D.     Florestine AversMLG/MEDQ  D:  09/03/2013  T:  09/03/2013  Job:  [id number]

## 2013-09-03 NOTE — Transfer of Care (Signed)
Immediate Anesthesia Transfer of Care Note  Patient: Laurie Dorsey  Procedure(s) Performed: Procedure(s) with comments: CESAREAN SECTION WITH BILATERAL TUBAL LIGATION (N/A) - Repeat  edc 09/10/13  Patient Location: PACU  Anesthesia Type:Spinal  Level of Consciousness: awake, alert  and oriented  Airway & Oxygen Therapy: Patient Spontanous Breathing  Post-op Assessment: Report given to PACU RN and Post -op Vital signs reviewed and stable  Post vital signs: Reviewed and stable  Complications: No apparent anesthesia complications

## 2013-09-03 NOTE — Anesthesia Preprocedure Evaluation (Signed)
Anesthesia Evaluation  Patient identified by MRN, date of birth, ID band Patient awake    Reviewed: Allergy & Precautions, H&P , NPO status , Patient's Chart, lab work & pertinent test results  Airway Mallampati: II      Dental   Pulmonary  breath sounds clear to auscultation        Cardiovascular Exercise Tolerance: Good Rhythm:regular Rate:Normal     Neuro/Psych    GI/Hepatic   Endo/Other    Renal/GU      Musculoskeletal   Abdominal   Peds  Hematology   Anesthesia Other Findings CHF (congestive heart failure)   Hx after 2010 delivery x 6 weeks, now all WNL   Reproductive/Obstetrics (+) Pregnancy                           Anesthesia Physical Anesthesia Plan  ASA: II  Anesthesia Plan: Spinal   Post-op Pain Management:    Induction:   Airway Management Planned:   Additional Equipment:   Intra-op Plan:   Post-operative Plan:   Informed Consent: I have reviewed the patients History and Physical, chart, labs and discussed the procedure including the risks, benefits and alternatives for the proposed anesthesia with the patient or authorized representative who has indicated his/her understanding and acceptance.     Plan Discussed with: Anesthesiologist, CRNA and Surgeon  Anesthesia Plan Comments:         Anesthesia Quick Evaluation

## 2013-09-03 NOTE — Brief Op Note (Signed)
09/03/2013  9:40 AM  PATIENT:  Laurie Dorsey  28 y.o. female  PRE-OPERATIVE DIAGNOSIS:  IUP at 8739 w, Previous C Section, desires permanent sterilization  POST-OPERATIVE DIAGNOSIS:  Same  PROCEDURE:  Procedure(s) with comments: CESAREAN SECTION WITH BILATERAL TUBAL LIGATION (N/A) - Repeat  edc 09/10/13  SURGEON:  Surgeon(s) and Role:    * Jeani HawkingMichelle L Korrina Zern, MD - Primary  PHYSICIAN ASSISTANT:   ASSISTANTS: none   ANESTHESIA:   spinal  EBL:  Total I/O In: 2600 [I.V.:2600] Out: 700 [Urine:100; Blood:600]  BLOOD ADMINISTERED:none  DRAINS: Urinary Catheter (Foley)   LOCAL MEDICATIONS USED:  NONE  SPECIMEN:  No Specimen  DISPOSITION OF SPECIMEN:  N/A  COUNTS:  YES  TOURNIQUET:  * No tourniquets in log *  DICTATION: .Other Dictation: Dictation Number [id number]  PLAN OF CARE: Admit to inpatient   PATIENT DISPOSITION:  PACU - hemodynamically stable.   Delay start of Pharmacological VTE agent (>24hrs) due to surgical blood loss or risk of bleeding: not applicable

## 2013-09-03 NOTE — Anesthesia Postprocedure Evaluation (Signed)
  Anesthesia Post Note  Patient: Laurie Dorsey  Procedure(s) Performed: Procedure(s) (LRB): CESAREAN SECTION WITH BILATERAL TUBAL LIGATION (N/A)  Anesthesia type: Spinal  Patient location: PACU  Post pain: Pain level controlled  Post assessment: Post-op Vital signs reviewed  Last Vitals:  Filed Vitals:   09/03/13 1045  BP: 100/63  Pulse: 66  Temp:   Resp: 16    Post vital signs: Reviewed  Level of consciousness: awake  Complications: No apparent anesthesia complications

## 2013-09-03 NOTE — Progress Notes (Signed)
History and physical on the chart. Wants BTL No other changes Will proceed with Repeat LTCS and BTL Consent signed.

## 2013-09-03 NOTE — Anesthesia Procedure Notes (Signed)
Spinal  Patient location during procedure: OR Start time: 09/03/2013 8:55 AM Staffing Anesthesiologist: Brayton CavesJACKSON, Sheikh Leverich Performed by: anesthesiologist  Preanesthetic Checklist Completed: patient identified, site marked, surgical consent, pre-op evaluation, timeout performed, IV checked, risks and benefits discussed and monitors and equipment checked Spinal Block Patient position: sitting Prep: DuraPrep Patient monitoring: heart rate, cardiac monitor, continuous pulse ox and blood pressure Approach: midline Location: L3-4 Injection technique: single-shot Needle Needle type: Sprotte  Needle gauge: 24 G Needle length: 9 cm Assessment Sensory level: T4 Additional Notes Patient identified.  Risk benefits discussed including failed block, incomplete pain control, headache, nerve damage, paralysis, blood pressure changes, nausea, vomiting, reactions to medication both toxic or allergic, and postpartum back pain.  Patient expressed understanding and wished to proceed.  All questions were answered.  Sterile technique used throughout procedure.  CSF was clear.  No parasthesia or other complications.  Please see nursing notes for vital signs.

## 2013-09-04 LAB — CBC
HCT: 31.4 % — ABNORMAL LOW (ref 36.0–46.0)
Hemoglobin: 10.3 g/dL — ABNORMAL LOW (ref 12.0–15.0)
MCH: 29.1 pg (ref 26.0–34.0)
MCHC: 32.8 g/dL (ref 30.0–36.0)
MCV: 88.7 fL (ref 78.0–100.0)
Platelets: 146 10*3/uL — ABNORMAL LOW (ref 150–400)
RBC: 3.54 MIL/uL — AB (ref 3.87–5.11)
RDW: 14.3 % (ref 11.5–15.5)
WBC: 10.8 10*3/uL — AB (ref 4.0–10.5)

## 2013-09-04 NOTE — Anesthesia Postprocedure Evaluation (Signed)
Anesthesia Post Note  Patient: Laurie Dorsey  Procedure(s) Performed: Procedure(s) (LRB): CESAREAN SECTION WITH BILATERAL TUBAL LIGATION (N/A)  Anesthesia type: Spinal  Patient location: Mother/Baby  Post pain: Pain level controlled  Post assessment: Post-op Vital signs reviewed  Last Vitals:  Filed Vitals:   09/04/13 1035  BP: 98/50  Pulse: 63  Temp: 37.5 C  Resp: 18    Post vital signs: Reviewed  Level of consciousness: awake  Complications: No apparent anesthesia complications

## 2013-09-04 NOTE — Addendum Note (Signed)
Addendum created 09/04/13 1103 by Algis GreenhouseLinda A Linley Moxley, CRNA   Modules edited: Notes Section   Notes Section:  File: [id number]

## 2013-09-04 NOTE — Lactation Note (Signed)
This note was copied from the chart of Laurie Venda Rodesrin Belleau. Lactation Consultation Note  Patient Name: Laurie Dorsey ONGEX'BToday's Date: 09/04/2013 Reason for consult: Other (Comment) (charting for exclusion)   Maternal Data Formula Feeding for Exclusion: Yes Reason for exclusion: Mother's choice to formula feed on admision  Feeding Feeding Type: Bottle Fed - Formula  LATCH Score/Interventions                      Lactation Tools Discussed/Used     Consult Status Consult Status: Complete    Zara ChessJoanne P Laquia Rosano 09/04/2013, 3:30 PM

## 2013-09-04 NOTE — Progress Notes (Signed)
Subjective: Postpartum Day 1: Cesarean Delivery Patient reports tolerating PO and no problems voiding.    Objective: Vital signs in last 24 hours: Temp:  [97.7 F (36.5 C)-99 F (37.2 C)] 98.3 F (36.8 C) (05/17 0643) Pulse Rate:  [55-94] 85 (05/17 0643) Resp:  [14-20] 20 (05/17 0643) BP: (82-108)/(48-76) 85/55 mmHg (05/17 0643) SpO2:  [95 %-100 %] 97 % (05/17 0643) Weight:  [77.111 kg (170 lb)] 77.111 kg (170 lb) (05/16 1300)  Physical Exam:  General: alert, cooperative and appears stated age 27Lochia: appropriate Uterine Fundus: firm Incision: healing well, no significant drainage, no dehiscence, no significant erythema DVT Evaluation: No evidence of DVT seen on physical exam.   Recent Labs  09/02/13 0945 09/04/13 0628  HGB 12.2 10.3*  HCT 36.7 31.4*    Assessment/Plan: Status post Cesarean section. Doing well postoperatively.  Continue current care.  Jeani HawkingMichelle L Javeria Briski 09/04/2013, 7:52 AM

## 2013-09-05 ENCOUNTER — Encounter (HOSPITAL_COMMUNITY): Payer: Self-pay | Admitting: Obstetrics and Gynecology

## 2013-09-05 MED ORDER — IBUPROFEN 600 MG PO TABS: 600.0000 mg | ORAL_TABLET | Freq: Four times a day (QID) | ORAL | Status: AC | PRN

## 2013-09-05 MED ORDER — OXYCODONE-ACETAMINOPHEN 5-325 MG PO TABS: 1.0000 | ORAL_TABLET | ORAL | Status: AC | PRN

## 2013-09-05 NOTE — Discharge Summary (Signed)
Obstetric Discharge Summary Reason for Admission: cesarean section Prenatal Procedures: none Intrapartum Procedures: cesarean: low cervical, transverse Postpartum Procedures: none Complications-Operative and Postpartum: none Hemoglobin  Date Value Ref Range Status  09/04/2013 10.3* 12.0 - 15.0 g/dL Final     HCT  Date Value Ref Range Status  09/04/2013 31.4* 36.0 - 46.0 % Final    Physical Exam:  General: alert, cooperative and appears stated age Lochia: appropriate Uterine Fundus: firm Incision: healing well, no significant drainage, no dehiscence DVT Evaluation: No evidence of DVT seen on physical exam. Negative Homan's sign. No cords or calf tenderness.  Discharge Diagnoses: Term Pregnancy-delivered  Discharge Information: Date: 09/05/2013 Activity: pelvic rest Diet: routine Medications: PNV, Ibuprofen and Percocet Condition: stable Instructions: refer to practice specific booklet Discharge to: home   Newborn Data: Live born female  Birth Weight: 7 lb 6.3 oz (3355 g) APGAR: 9, 9  Home with mother.  Laurie Dorsey 09/05/2013, 8:19 AM

## 2013-09-05 NOTE — Discharge Instructions (Signed)
Call MD for T>100.4, heavy vaginal bleeding, severe abdominal pain, intractable nausea and/or vomiting.  Call office to schedule incision check in 1 week.  No driving while taking narcotics.  No heavy lifting.  Pelvic rest x 6 weeks.

## 2013-09-05 NOTE — Progress Notes (Signed)
Subjective: Postpartum Day 2: Cesarean Delivery Patient reports tolerating PO and no problems voiding.  Requests discharge home today.    Objective: Vital signs in last 24 hours: Temp:  [97.9 F (36.6 C)-99.5 F (37.5 C)] 97.9 F (36.6 C) (05/18 0626) Pulse Rate:  [63-70] 65 (05/18 0626) Resp:  [18-20] 18 (05/18 0626) BP: (95-98)/(50-64) 96/63 mmHg (05/18 0626) SpO2:  [99 %] 99 % (05/17 1035)  Physical Exam:  General: alert, cooperative and appears stated age Lochia: appropriate Uterine Fundus: firm Incision: healing well, no significant drainage, no dehiscence DVT Evaluation: No evidence of DVT seen on physical exam. Negative Homan's sign. No cords or calf tenderness.   Recent Labs  09/02/13 0945 09/04/13 0628  HGB 12.2 10.3*  HCT 36.7 31.4*    Assessment/Plan: Status post Cesarean section. Doing well postoperatively.  Discharge home with standard precautions and return to clinic in 4-6 weeks.  Mitchel HonourMegan Raeden Belzer 09/05/2013, 8:16 AM

## 2013-09-05 NOTE — Progress Notes (Signed)
Pt is rubella non-immune but declines MMR vaccine.

## 2014-02-20 ENCOUNTER — Encounter (HOSPITAL_COMMUNITY): Payer: Self-pay | Admitting: Obstetrics and Gynecology

## 2017-07-30 DIAGNOSIS — R079 Chest pain, unspecified: Secondary | ICD-10-CM | POA: Diagnosis not present

## 2017-07-30 DIAGNOSIS — R0600 Dyspnea, unspecified: Secondary | ICD-10-CM | POA: Diagnosis not present

## 2018-01-04 DIAGNOSIS — M25562 Pain in left knee: Secondary | ICD-10-CM | POA: Diagnosis not present

## 2018-01-04 DIAGNOSIS — M25569 Pain in unspecified knee: Secondary | ICD-10-CM | POA: Diagnosis not present

## 2018-01-15 DIAGNOSIS — M25562 Pain in left knee: Secondary | ICD-10-CM | POA: Diagnosis not present

## 2018-01-20 DIAGNOSIS — M25562 Pain in left knee: Secondary | ICD-10-CM | POA: Diagnosis not present

## 2019-06-08 DIAGNOSIS — Z20822 Contact with and (suspected) exposure to covid-19: Secondary | ICD-10-CM | POA: Diagnosis not present

## 2019-06-08 DIAGNOSIS — R432 Parageusia: Secondary | ICD-10-CM | POA: Diagnosis not present

## 2019-06-08 DIAGNOSIS — R05 Cough: Secondary | ICD-10-CM | POA: Diagnosis not present

## 2019-08-09 DIAGNOSIS — S8392XD Sprain of unspecified site of left knee, subsequent encounter: Secondary | ICD-10-CM | POA: Diagnosis not present

## 2019-10-28 DIAGNOSIS — I4891 Unspecified atrial fibrillation: Secondary | ICD-10-CM | POA: Diagnosis not present
